# Patient Record
Sex: Male | Born: 1980 | Hispanic: No | Marital: Married | Smoking: Former smoker
Health system: Southern US, Community
[De-identification: ages and names within clinical notes are randomized; demographics above are authoritative.]

## PROBLEM LIST (undated history)

## (undated) HISTORY — PX: ELBOW ARTHROSCOPY: SUR87

---

## 2018-04-24 ENCOUNTER — Encounter: Payer: Self-pay | Admitting: Family Medicine

## 2018-04-24 ENCOUNTER — Ambulatory Visit (INDEPENDENT_AMBULATORY_CARE_PROVIDER_SITE_OTHER): Payer: 59 | Admitting: Family Medicine

## 2018-04-24 VITALS — BP 118/70 | HR 101 | Temp 99.1°F | Resp 12 | Ht 63.0 in | Wt 155.0 lb

## 2018-04-24 DIAGNOSIS — Z0001 Encounter for general adult medical examination with abnormal findings: Secondary | ICD-10-CM

## 2018-04-24 DIAGNOSIS — Z114 Encounter for screening for human immunodeficiency virus [HIV]: Secondary | ICD-10-CM

## 2018-04-24 DIAGNOSIS — Z13220 Encounter for screening for lipoid disorders: Secondary | ICD-10-CM

## 2018-04-24 DIAGNOSIS — R000 Tachycardia, unspecified: Secondary | ICD-10-CM

## 2018-04-24 NOTE — Progress Notes (Signed)
Subjective:  Eddie Mckay is a 37 y.o. male who presents today for his annual comprehensive physical exam and to establish care.  HPI:  He has no acute complaints today.   Lifestyle Diet: No specific diets.  Exercise: No specific exercise. Used to play cricket. Played for professional club. Would like to get back into volleyball and swimming.   Depression screen PHQ 2/9 04/24/2018  Decreased Interest 0  Down, Depressed, Hopeless 0  PHQ - 2 Score 0   Health Maintenance Due  Topic Date Due  . HIV Screening  01/22/1995  . TETANUS/TDAP  01/22/1999    ROS: Per HPI, otherwise a complete review of systems was negative.   PMH:  The following were reviewed and entered/updated in epic: History reviewed. No pertinent past medical history. There are no active problems to display for this patient.  Past Surgical History:  Procedure Laterality Date  . ELBOW ARTHROSCOPY Right    When he was in third grade had elbow dislocation    Family History  Problem Relation Age of Onset  . Arthritis Mother   . Hypertension Mother   . Diabetes Father   . Hypertension Father    Medications- reviewed and updated No current outpatient medications on file.   No current facility-administered medications for this visit.    Allergies-reviewed and updated No Known Allergies  Social History   Socioeconomic History  . Marital status: Married    Spouse name: Not on file  . Number of children: 2  . Years of education: Not on file  . Highest education level: Not on file  Occupational History  . Not on file  Social Needs  . Financial resource strain: Not on file  . Food insecurity:    Worry: Not on file    Inability: Not on file  . Transportation needs:    Medical: Not on file    Non-medical: Not on file  Tobacco Use  . Smoking status: Never Smoker  . Smokeless tobacco: Never Used  Substance and Sexual Activity  . Alcohol use: Yes    Comment: Occasionall  . Drug use: Never  .  Sexual activity: Not on file  Lifestyle  . Physical activity:    Days per week: Not on file    Minutes per session: Not on file  . Stress: Not on file  Relationships  . Social connections:    Talks on phone: Not on file    Gets together: Not on file    Attends religious service: Not on file    Active member of club or organization: Not on file    Attends meetings of clubs or organizations: Not on file    Relationship status: Not on file  Other Topics Concern  . Not on file  Social History Narrative  . Not on file    Objective:  Physical Exam: BP 118/70   Pulse (!) 101   Temp 99.1 F (37.3 C) (Oral)   Resp 12   Ht  (1.6 m)   Wt 155 lb (70.3 kg)   SpO2 96%   BMI 27.46 kg/m   Body mass index is 27.46 kg/m. Wt Readings from Last 3 Encounters:  04/24/18 155 lb (70.3 kg)  Gen: NAD, resting comfortably HEENT: TMs normal bilaterally. OP clear. No thyromegaly noted.  CV: RRR with no murmurs appreciated Pulm: NWOB, CTAB with no crackles, wheezes, or rhonchi GI: Normal bowel sounds present. Soft, Nontender, Nondistended. MSK: no edema, cyanosis, or clubbing noted Skin: warm,  dry Neuro: CN2-12 grossly intact. Strength 5/5 in upper and lower extremities. Reflexes symmetric and intact bilaterally.  Psych: Normal affect and thought content  Assessment/Plan:  Tachycardia Heart rate 101 on initial vitals.  Regular rate on physical exam.  Check CBC and CMET.  Preventative Healthcare: Patient will return soon for fasting blood work. Check lipid panel and HIV antibody.   Patient Counseling(The following topics were reviewed and/or handout was given):  -Nutrition: Stressed importance of moderation in sodium/caffeine intake, saturated fat and cholesterol, caloric balance, sufficient intake of fresh fruits, vegetables, and fiber.  -Stressed the importance of regular exercise.   -Substance Abuse: Discussed cessation/primary prevention of tobacco, alcohol, or other drug use;  driving or other dangerous activities under the influence; availability of treatment for abuse.   -Injury prevention: Discussed safety belts, safety helmets, smoke detector, smoking near bedding or upholstery.   -Sexuality: Discussed sexually transmitted diseases, partner selection, use of condoms, avoidance of unintended pregnancy and contraceptive alternatives.   -Dental health: Discussed importance of regular tooth brushing, flossing, and dental visits.  -Health maintenance and immunizations reviewed. Please refer to Health maintenance section.  Return to care in 1 year for next preventative visit.   Katina Degree. Jimmey Ralph, MD 04/24/2018 3:40 PM

## 2018-04-24 NOTE — Patient Instructions (Signed)
Preventive Care 18-39 Years, Male Preventive care refers to lifestyle choices and visits with your health care provider that can promote health and wellness. What does preventive care include?  A yearly physical exam. This is also called an annual well check.  Dental exams once or twice a year.  Routine eye exams. Ask your health care provider how often you should have your eyes checked.  Personal lifestyle choices, including: ? Daily care of your teeth and gums. ? Regular physical activity. ? Eating a healthy diet. ? Avoiding tobacco and drug use. ? Limiting alcohol use. ? Practicing safe sex. What happens during an annual well check? The services and screenings done by your health care provider during your annual well check will depend on your age, overall health, lifestyle risk factors, and family history of disease. Counseling Your health care provider may ask you questions about your:  Alcohol use.  Tobacco use.  Drug use.  Emotional well-being.  Home and relationship well-being.  Sexual activity.  Eating habits.  Work and work Statistician.  Screening You may have the following tests or measurements:  Height, weight, and BMI.  Blood pressure.  Lipid and cholesterol levels. These may be checked every 5 years starting at age 34.  Diabetes screening. This is done by checking your blood sugar (glucose) after you have not eaten for a while (fasting).  Skin check.  Hepatitis C blood test.  Hepatitis B blood test.  Sexually transmitted disease (STD) testing.  Discuss your test results, treatment options, and if necessary, the need for more tests with your health care provider. Vaccines Your health care provider may recommend certain vaccines, such as:  Influenza vaccine. This is recommended every year.  Tetanus, diphtheria, and acellular pertussis (Tdap, Td) vaccine. You may need a Td booster every 10 years.  Varicella vaccine. You may need this if you  have not been vaccinated.  HPV vaccine. If you are 23 or younger, you may need three doses over 6 months.  Measles, mumps, and rubella (MMR) vaccine. You may need at least one dose of MMR.You may also need a second dose.  Pneumococcal 13-valent conjugate (PCV13) vaccine. You may need this if you have certain conditions and have not been vaccinated.  Pneumococcal polysaccharide (PPSV23) vaccine. You may need one or two doses if you smoke cigarettes or if you have certain conditions.  Meningococcal vaccine. One dose is recommended if you are age 65-21 years and a first-year college student living in a residence hall, or if you have one of several medical conditions. You may also need additional booster doses.  Hepatitis A vaccine. You may need this if you have certain conditions or if you travel or work in places where you may be exposed to hepatitis A.  Hepatitis B vaccine. You may need this if you have certain conditions or if you travel or work in places where you may be exposed to hepatitis B.  Haemophilus influenzae type b (Hib) vaccine. You may need this if you have certain risk factors.  Talk to your health care provider about which screenings and vaccines you need and how often you need them. This information is not intended to replace advice given to you by your health care provider. Make sure you discuss any questions you have with your health care provider. Document Released: 02/01/2002 Document Revised: 08/25/2016 Document Reviewed: 10/07/2015 Elsevier Interactive Patient Education  [employer].

## 2018-05-04 ENCOUNTER — Other Ambulatory Visit (INDEPENDENT_AMBULATORY_CARE_PROVIDER_SITE_OTHER): Payer: 59

## 2018-05-04 DIAGNOSIS — Z13220 Encounter for screening for lipoid disorders: Secondary | ICD-10-CM | POA: Diagnosis not present

## 2018-05-04 DIAGNOSIS — R000 Tachycardia, unspecified: Secondary | ICD-10-CM | POA: Diagnosis not present

## 2018-05-04 DIAGNOSIS — Z114 Encounter for screening for human immunodeficiency virus [HIV]: Secondary | ICD-10-CM

## 2018-05-04 LAB — CBC
HCT: 43.9 % (ref 39.0–52.0)
Hemoglobin: 15.1 g/dL (ref 13.0–17.0)
MCHC: 34.4 g/dL (ref 30.0–36.0)
MCV: 82.1 fl (ref 78.0–100.0)
Platelets: 300 10*3/uL (ref 150.0–400.0)
RBC: 5.35 Mil/uL (ref 4.22–5.81)
RDW: 13.2 % (ref 11.5–15.5)
WBC: 7.2 10*3/uL (ref 4.0–10.5)

## 2018-05-04 LAB — COMPREHENSIVE METABOLIC PANEL
ALBUMIN: 4.4 g/dL (ref 3.5–5.2)
ALT: 38 U/L (ref 0–53)
AST: 21 U/L (ref 0–37)
Alkaline Phosphatase: 70 U/L (ref 39–117)
BILIRUBIN TOTAL: 0.5 mg/dL (ref 0.2–1.2)
BUN: 14 mg/dL (ref 6–23)
CO2: 28 meq/L (ref 19–32)
CREATININE: 0.82 mg/dL (ref 0.40–1.50)
Calcium: 9.6 mg/dL (ref 8.4–10.5)
Chloride: 103 mEq/L (ref 96–112)
GFR: 111.59 mL/min (ref >60.00)
Glucose, Bld: 94 mg/dL (ref 70–99)
Potassium: 4.7 mEq/L (ref 3.5–5.1)
Sodium: 139 mEq/L (ref 135–145)
TOTAL PROTEIN: 7.8 g/dL (ref 6.0–8.3)

## 2018-05-04 LAB — LIPID PANEL
CHOL/HDL RATIO: 5
CHOLESTEROL: 234 mg/dL — AB (ref 0–200)
HDL: 43.2 mg/dL (ref >39.00)
LDL Cholesterol: 164 mg/dL — ABNORMAL HIGH (ref 0–99)
NonHDL: 190.69
TRIGLYCERIDES: 133 mg/dL (ref 0.0–149.0)
VLDL: 26.6 mg/dL (ref 0.0–40.0)

## 2018-05-04 LAB — TSH: TSH: 1.9 u[IU]/mL (ref 0.35–4.50)

## 2018-05-05 LAB — HIV ANTIBODY (ROUTINE TESTING W REFLEX): HIV: NONREACTIVE

## 2018-07-06 ENCOUNTER — Ambulatory Visit: Payer: 59 | Admitting: Sports Medicine

## 2018-07-06 ENCOUNTER — Encounter: Payer: Self-pay | Admitting: Sports Medicine

## 2018-07-06 VITALS — BP 114/72 | HR 93 | Ht 63.0 in | Wt 153.4 lb

## 2018-07-06 DIAGNOSIS — M9908 Segmental and somatic dysfunction of rib cage: Secondary | ICD-10-CM

## 2018-07-06 DIAGNOSIS — K219 Gastro-esophageal reflux disease without esophagitis: Secondary | ICD-10-CM

## 2018-07-06 DIAGNOSIS — Z720 Tobacco use: Secondary | ICD-10-CM

## 2018-07-06 DIAGNOSIS — M25512 Pain in left shoulder: Secondary | ICD-10-CM | POA: Diagnosis not present

## 2018-07-06 DIAGNOSIS — M9902 Segmental and somatic dysfunction of thoracic region: Secondary | ICD-10-CM

## 2018-07-06 DIAGNOSIS — M9901 Segmental and somatic dysfunction of cervical region: Secondary | ICD-10-CM | POA: Diagnosis not present
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
## 2018-07-06 NOTE — Progress Notes (Signed)
PROCEDURE NOTE : OSTEOPATHIC MANIPULATION The decision today to treat with Osteopathic Manipulative Therapy (OMT) was based on physical exam findings. Verbal consent was obtained following a discussion with the patient regarding the of risks, benefits and potential side effects, including an acute pain flare,post manipulation soreness and need for repeat treatments.     NONE  Manipulation was performed as below: Regions treated: Cervical spine, Ribs and Thoracic spine OMT Techniques Used: HVLA, muscle energy and myofascial release  The patient tolerated the treatment well and reported Improved symptoms following treatment today. Patient was given medications, exercises, stretches and lifestyle modifications per AVS and verbally.   OSTEOPATHIC/STRUCTURAL EXAM:   C2-C4 rotated right, side bent left C6 rotated left, side bent left T2-T4 neutral rotated left, side bent right Posterior rib 6 on the right.

## 2018-10-18 ENCOUNTER — Ambulatory Visit (INDEPENDENT_AMBULATORY_CARE_PROVIDER_SITE_OTHER): Payer: 59 | Admitting: Surgical

## 2018-10-18 ENCOUNTER — Encounter: Payer: Self-pay | Admitting: Family Medicine

## 2018-10-18 DIAGNOSIS — Z23 Encounter for immunization: Secondary | ICD-10-CM

## 2019-01-12 ENCOUNTER — Encounter (HOSPITAL_BASED_OUTPATIENT_CLINIC_OR_DEPARTMENT_OTHER): Payer: Self-pay | Admitting: Emergency Medicine

## 2019-01-12 ENCOUNTER — Emergency Department (HOSPITAL_BASED_OUTPATIENT_CLINIC_OR_DEPARTMENT_OTHER)
Admission: EM | Admit: 2019-01-12 | Discharge: 2019-01-12 | Disposition: A | Discharge status: Home / Self Care | Payer: 59 | Attending: Emergency Medicine | Admitting: Emergency Medicine

## 2019-01-12 ENCOUNTER — Other Ambulatory Visit: Payer: Self-pay

## 2019-01-12 ENCOUNTER — Emergency Department (HOSPITAL_BASED_OUTPATIENT_CLINIC_OR_DEPARTMENT_OTHER): Payer: 59

## 2019-01-12 DIAGNOSIS — Y939 Activity, unspecified: Secondary | ICD-10-CM | POA: Diagnosis not present

## 2019-01-12 DIAGNOSIS — Y92410 Unspecified street and highway as the place of occurrence of the external cause: Secondary | ICD-10-CM | POA: Diagnosis not present

## 2019-01-12 DIAGNOSIS — S161XXA Strain of muscle, fascia and tendon at neck level, initial encounter: Secondary | ICD-10-CM | POA: Diagnosis not present

## 2019-01-12 DIAGNOSIS — Y999 Unspecified external cause status: Secondary | ICD-10-CM | POA: Insufficient documentation

## 2019-01-12 DIAGNOSIS — F17210 Nicotine dependence, cigarettes, uncomplicated: Secondary | ICD-10-CM | POA: Insufficient documentation

## 2019-01-12 DIAGNOSIS — S20211A Contusion of right front wall of thorax, initial encounter: Secondary | ICD-10-CM

## 2019-01-12 DIAGNOSIS — S39012A Strain of muscle, fascia and tendon of lower back, initial encounter: Secondary | ICD-10-CM

## 2019-01-12 DIAGNOSIS — S199XXA Unspecified injury of neck, initial encounter: Secondary | ICD-10-CM | POA: Diagnosis present

## 2019-01-12 MED ORDER — IBUPROFEN 400 MG PO TABS
600.0000 mg | ORAL_TABLET | Freq: Once | ORAL | Status: AC
  Administered 2019-01-12: 600 mg via ORAL
  Filled 2019-01-12: qty 1

## 2019-01-12 NOTE — ED Notes (Addendum)
This RN, Mordecai Maes, completed Triage process

## 2019-01-12 NOTE — ED Triage Notes (Signed)
C-collar place during triage

## 2019-01-12 NOTE — Discharge Instructions (Addendum)
If you develop severe headache, severe pain, trouble breathing, weakness or numbness in your arms or legs, trouble speaking or swallowing, or any other new/concerning symptoms then return to the ER for evaluation.
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
## 2019-01-12 NOTE — ED Triage Notes (Signed)
Pt was ambulatory in triage
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
## 2019-02-02 ENCOUNTER — Ambulatory Visit: Payer: 59 | Admitting: Family Medicine

## 2019-02-02 ENCOUNTER — Encounter: Payer: Self-pay | Admitting: Family Medicine

## 2019-02-02 VITALS — BP 110/78 | HR 102 | Temp 99.2°F | Ht 63.0 in | Wt 150.8 lb

## 2019-02-02 DIAGNOSIS — R0781 Pleurodynia: Secondary | ICD-10-CM

## 2019-02-02 DIAGNOSIS — J101 Influenza due to other identified influenza virus with other respiratory manifestations: Secondary | ICD-10-CM | POA: Diagnosis not present

## 2019-02-02 LAB — POCT INFLUENZA A/B
Influenza A, POC: POSITIVE — AB
Influenza B, POC: NEGATIVE

## 2019-02-02 MED ORDER — IPRATROPIUM BROMIDE 0.06 % NA SOLN: 2.0000 | Freq: Four times a day (QID) | NASAL | 0 refills | Status: DC

## 2019-02-02 MED ORDER — GUAIFENESIN-CODEINE 100-10 MG/5ML PO SOLN: 5.0000 mL | Freq: Three times a day (TID) | ORAL | 0 refills | Status: DC | PRN
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
## 2019-02-02 NOTE — Patient Instructions (Signed)
It was very nice to see you today!  You have the flu.  This should continue to improve over the next few days.  Please try the nasal spray and cough medication.  Please stay well-hydrated.  You can take Tylenol/or Motrin as needed.  Please let me know if your symptoms worsen or do not improve the next few days.  Take care, Dr Jimmey Ralph
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
## 2020-05-12 ENCOUNTER — Encounter: Payer: Self-pay | Admitting: Family Medicine

## 2020-05-12 ENCOUNTER — Ambulatory Visit (INDEPENDENT_AMBULATORY_CARE_PROVIDER_SITE_OTHER): Payer: 59 | Admitting: Family Medicine

## 2020-05-12 ENCOUNTER — Other Ambulatory Visit: Payer: Self-pay

## 2020-05-12 VITALS — BP 120/80 | HR 76 | Temp 98.4°F | Ht 62.0 in | Wt 151.2 lb

## 2020-05-12 DIAGNOSIS — Z6827 Body mass index (BMI) 27.0-27.9, adult: Secondary | ICD-10-CM | POA: Diagnosis not present

## 2020-05-12 DIAGNOSIS — F17200 Nicotine dependence, unspecified, uncomplicated: Secondary | ICD-10-CM

## 2020-05-12 DIAGNOSIS — E663 Overweight: Secondary | ICD-10-CM | POA: Diagnosis not present

## 2020-05-12 DIAGNOSIS — E785 Hyperlipidemia, unspecified: Secondary | ICD-10-CM | POA: Diagnosis not present

## 2020-05-12 DIAGNOSIS — Z0001 Encounter for general adult medical examination with abnormal findings: Secondary | ICD-10-CM | POA: Diagnosis not present

## 2020-05-12 LAB — COMPREHENSIVE METABOLIC PANEL
ALT: 33 U/L (ref 0–53)
AST: 26 U/L (ref 0–37)
Albumin: 4.6 g/dL (ref 3.5–5.2)
Alkaline Phosphatase: 98 U/L (ref 39–117)
BUN: 10 mg/dL (ref 6–23)
CO2: 27 mEq/L (ref 19–32)
Calcium: 9.1 mg/dL (ref 8.4–10.5)
Chloride: 104 mEq/L (ref 96–112)
Creatinine, Ser: 0.82 mg/dL (ref 0.40–1.50)
GFR: 103.9 mL/min (ref >60.00)
Glucose, Bld: 94 mg/dL (ref 70–99)
Potassium: 4.4 mEq/L (ref 3.5–5.1)
Sodium: 138 mEq/L (ref 135–145)
Total Bilirubin: 0.5 mg/dL (ref 0.2–1.2)
Total Protein: 7.2 g/dL (ref 6.0–8.3)

## 2020-05-12 LAB — TSH: TSH: 2.38 u[IU]/mL (ref 0.35–4.50)

## 2020-05-12 LAB — CBC
HCT: 41.1 % (ref 39.0–52.0)
Hemoglobin: 14 g/dL (ref 13.0–17.0)
MCHC: 34 g/dL (ref 30.0–36.0)
MCV: 81.9 fl (ref 78.0–100.0)
Platelets: 279 10*3/uL (ref 150.0–400.0)
RBC: 5.02 Mil/uL (ref 4.22–5.81)
RDW: 13.7 % (ref 11.5–15.5)
WBC: 6.9 10*3/uL (ref 4.0–10.5)

## 2020-05-12 LAB — LIPID PANEL
Cholesterol: 231 mg/dL — ABNORMAL HIGH (ref 0–200)
HDL: 43.6 mg/dL (ref >39.00)
LDL Cholesterol: 160 mg/dL — ABNORMAL HIGH (ref 0–99)
NonHDL: 187.62
Total CHOL/HDL Ratio: 5
Triglycerides: 138 mg/dL (ref 0.0–149.0)
VLDL: 27.6 mg/dL (ref 0.0–40.0)

## 2020-05-12 NOTE — Assessment & Plan Note (Signed)
Check CBC, CMET, TSH, and lipid panel. 

## 2020-05-12 NOTE — Progress Notes (Signed)
Chief Complaint:  Eddie Mckay is a 39 y.o. male who presents today for his annual comprehensive physical exam.    Assessment/Plan:  Chronic Problems Addressed Today: Nicotine dependence with current use Patient was asked about his tobacco use today and was strongly advised to quit. Patient is currently contemplative. We reviewed treatment options to assist him quit smoking including NRT, Chantix, and Bupropion. Follow up at next office visit.   Total time spent counseling approximately 3 minutes.    Dyslipidemia Check CBC, CMET, TSH, and lipid panel.    Body mass index is 27.65 kg/m. / Overweight BMI Metric Follow Up - 05/12/20 0944      BMI Metric Follow Up-Please document annually   BMI Metric Follow Up  Education provided        Preventative Healthcare: Check CBC, CMET, TSH, lipid panel.   Patient Counseling(The following topics were reviewed and/or handout was given):  -Nutrition: Stressed importance of moderation in sodium/caffeine intake, saturated fat and cholesterol, caloric balance, sufficient intake of fresh fruits, vegetables, and fiber.  -Stressed the importance of regular exercise.   -Substance Abuse: Discussed cessation/primary prevention of tobacco, alcohol, or other drug use; driving or other dangerous activities under the influence; availability of treatment for abuse.   -Injury prevention: Discussed safety belts, safety helmets, smoke detector, smoking near bedding or upholstery.   -Sexuality: Discussed sexually transmitted diseases, partner selection, use of condoms, avoidance of unintended pregnancy and contraceptive alternatives.   -Dental health: Discussed importance of regular tooth brushing, flossing, and dental visits.  -Health maintenance and immunizations reviewed. Please refer to Health maintenance section.  Return to care in 1 year for next preventative visit.     Subjective:  HPI:  He has no acute complaints today.   Lifestyle Diet:  Balanced. Trying to get more fruits and vegetables.  Exercise: More active. Likes to walk. Getting about 30 minutes per day.   Depression screen PHQ 2/9 04/24/2018  Decreased Interest 0  Down, Depressed, Hopeless 0  PHQ - 2 Score 0    Health Maintenance Due  Topic Date Due  . TETANUS/TDAP  Never done     ROS: Per HPI, otherwise a complete review of systems was negative.   PMH:  The following were reviewed and entered/updated in epic: History reviewed. No pertinent past medical history. Patient Active Problem List   Diagnosis Date Noted  . Dyslipidemia 05/12/2020  . Nicotine dependence with current use 05/12/2020   Past Surgical History:  Procedure Laterality Date  . ELBOW ARTHROSCOPY Right    When he was in third grade had elbow dislocation    Family History  Problem Relation Age of Onset  . Arthritis Mother   . Hypertension Mother   . Diabetes Father   . Hypertension Father     Medications- reviewed and updated No current outpatient medications on file.   No current facility-administered medications for this visit.    Allergies-reviewed and updated No Known Allergies  Social History   Socioeconomic History  . Marital status: Married    Spouse name: Not on file  . Number of children: 2  . Years of education: Not on file  . Highest education level: Not on file  Occupational History  . Not on file  Tobacco Use  . Smoking status: Light Tobacco Smoker    Types: Cigarettes  . Smokeless tobacco: Never Used  Substance and Sexual Activity  . Alcohol use: Yes    Comment: Occasionall  . Drug use: Never  .  Sexual activity: Not on file  Other Topics Concern  . Not on file  Social History Narrative  . Not on file   Social Determinants of Health   Financial Resource Strain:   . Difficulty of Paying Living Expenses:   Food Insecurity:   . Worried About Charity fundraiser in the Last Year:   . Arboriculturist in the Last Year:   Transportation Needs:   .  Film/video editor (Medical):   Marland Kitchen Lack of Transportation (Non-Medical):   Physical Activity:   . Days of Exercise per Week:   . Minutes of Exercise per Session:   Stress:   . Feeling of Stress :   Social Connections:   . Frequency of Communication with Friends and Family:   . Frequency of Social Gatherings with Friends and Family:   . Attends Religious Services:   . Active Member of Clubs or Organizations:   . Attends Archivist Meetings:   Marland Kitchen Marital Status:         Objective:  Physical Exam: BP 120/80 (BP Location: Left Arm, Patient Position: Sitting, Cuff Size: Normal)   Pulse 76   Temp 98.4 F (36.9 C) (Temporal)   Ht 5\' 2"  (1.575 m)   Wt 151 lb 3.2 oz (68.6 kg)   SpO2 96%   BMI 27.65 kg/m   Body mass index is 27.65 kg/m. Wt Readings from Last 3 Encounters:  05/12/20 151 lb 3.2 oz (68.6 kg)  02/02/19 150 lb 12 oz (68.4 kg)  01/12/19 149 lb 14.6 oz (68 kg)   Gen: NAD, resting comfortably HEENT: TMs normal bilaterally. OP clear. No thyromegaly noted.  CV: RRR with no murmurs appreciated Pulm: NWOB, CTAB with no crackles, wheezes, or rhonchi GI: Normal bowel sounds present. Soft, Nontender, Nondistended. MSK: no edema, cyanosis, or clubbing noted Skin: warm, dry Neuro: CN2-12 grossly intact. Strength 5/5 in upper and lower extremities. Reflexes symmetric and intact bilaterally.  Psych: Normal affect and thought content     Jaelin Devincentis M. Jerline Pain, MD 05/12/2020 9:44 AM

## 2020-05-12 NOTE — Patient Instructions (Signed)
It was very nice to see you today!  We will check blood work today.  Keep up the good work.  Come back in 1 year for your next physical or sooner if needed.   Take care, Dr Jerline Pain  Please try these tips to maintain a healthy lifestyle:   Eat at least 3 REAL meals and 1-2 snacks per day.  Aim for no more than 5 hours between eating.  If you eat breakfast, please do so within one hour of getting up.    Each meal should contain half fruits/vegetables, one quarter protein, and one quarter carbs (no bigger than a computer mouse)   Cut down on sweet beverages. This includes juice, soda, and sweet tea.     Drink at least 1 glass of water with each meal and aim for at least 8 glasses per day   Exercise at least 150 minutes every week.    Preventive Care 87-40 Years Old, Male Preventive care refers to lifestyle choices and visits with your health care provider that can promote health and wellness. This includes:  A yearly physical exam. This is also called an annual well check.  Regular dental and eye exams.  Immunizations.  Screening for certain conditions.  Healthy lifestyle choices, such as eating a healthy diet, getting regular exercise, not using drugs or products that contain nicotine and tobacco, and limiting alcohol use. What can I expect for my preventive care visit? Physical exam Your health care provider will check:  Height and weight. These may be used to calculate body mass index (BMI), which is a measurement that tells if you are at a healthy weight.  Heart rate and blood pressure.  Your skin for abnormal spots. Counseling Your health care provider may ask you questions about:  Alcohol, tobacco, and drug use.  Emotional well-being.  Home and relationship well-being.  Sexual activity.  Eating habits.  Work and work Statistician. What immunizations do I need?  Influenza (flu) vaccine  This is recommended every year. Tetanus, diphtheria, and  pertussis (Tdap) vaccine  You may need a Td booster every 10 years. Varicella (chickenpox) vaccine  You may need this vaccine if you have not already been vaccinated. Zoster (shingles) vaccine  You may need this after age 61. Measles, mumps, and rubella (MMR) vaccine  You may need at least one dose of MMR if you were born in 1957 or later. You may also need a second dose. Pneumococcal conjugate (PCV13) vaccine  You may need this if you have certain conditions and were not previously vaccinated. Pneumococcal polysaccharide (PPSV23) vaccine  You may need one or two doses if you smoke cigarettes or if you have certain conditions. Meningococcal conjugate (MenACWY) vaccine  You may need this if you have certain conditions. Hepatitis A vaccine  You may need this if you have certain conditions or if you travel or work in places where you may be exposed to hepatitis A. Hepatitis B vaccine  You may need this if you have certain conditions or if you travel or work in places where you may be exposed to hepatitis B. Haemophilus influenzae type b (Hib) vaccine  You may need this if you have certain risk factors. Human papillomavirus (HPV) vaccine  If recommended by your health care provider, you may need three doses over 6 months. You may receive vaccines as individual doses or as more than one vaccine together in one shot (combination vaccines). Talk with your health care provider about the risks and benefits  of combination vaccines. What tests do I need? Blood tests  Lipid and cholesterol levels. These may be checked every 5 years, or more frequently if you are over 14 years old.  Hepatitis C test.  Hepatitis B test. Screening  Lung cancer screening. You may have this screening every year starting at age 41 if you have a 30-pack-year history of smoking and currently smoke or have quit within the past 15 years.  Prostate cancer screening. Recommendations will vary depending on your  family history and other risks.  Colorectal cancer screening. All adults should have this screening starting at age 11 and continuing until age 37. Your health care provider may recommend screening at age 55 if you are at increased risk. You will have tests every 1-10 years, depending on your results and the type of screening test.  Diabetes screening. This is done by checking your blood sugar (glucose) after you have not eaten for a while (fasting). You may have this done every 1-3 years.  Sexually transmitted disease (STD) testing. Follow these instructions at home: Eating and drinking  Eat a diet that includes fresh fruits and vegetables, whole grains, lean protein, and low-fat dairy products.  Take vitamin and mineral supplements as recommended by your health care provider.  Do not drink alcohol if your health care provider tells you not to drink.  If you drink alcohol: ? Limit how much you have to 0-2 drinks a day. ? Be aware of how much alcohol is in your drink. In the [place] one drink equals one 12 oz bottle of beer (355 mL), one 5 oz glass of wine (148 mL), or one 1 oz glass of hard liquor (44 mL). Lifestyle  Take daily care of your teeth and gums.  Stay active. Exercise for at least 30 minutes on 5 or more days each week.  Do not use any products that contain nicotine or tobacco, such as cigarettes, e-cigarettes, and chewing tobacco. If you need help quitting, ask your health care provider.  If you are sexually active, practice safe sex. Use a condom or other form of protection to prevent STIs (sexually transmitted infections).  Talk with your health care provider about taking a low-dose aspirin every day starting at age 66. What's next?  Go to your health care provider once a year for a well check visit.  Ask your health care provider how often you should have your eyes and teeth checked.  Stay up to date on all vaccines. This information is not intended to replace  advice given to you by your health care provider. Make sure you discuss any questions you have with your health care provider. Document Revised: 11/30/2018 Document Reviewed: 11/30/2018 Elsevier Patient Education  2020 [employer].

## 2020-05-12 NOTE — Assessment & Plan Note (Signed)
Patient was asked about his tobacco use today and was strongly advised to quit. Patient is currently contemplative. We reviewed treatment options to assist him quit smoking including NRT, Chantix, and Bupropion. Follow up at next office visit.   Total time spent counseling approximately 3 minutes.   

## 2020-05-13 NOTE — Progress Notes (Signed)
Please inform patient of the following:  Cholesterol is mildly elevated but everything is STABLE. Do not need to start any medications at this time. Would like for him to keep working on diet and exercise and we can recheck in a year or so.  Katina Degree. Jimmey Ralph, MD 05/13/2020 1:03 PM

## 2020-07-14 ENCOUNTER — Inpatient Hospital Stay
Admission: RE | Admit: 2020-07-14 | Discharge: 2020-07-14 | Disposition: A | Discharge status: Home / Self Care | Payer: 59 | Source: Ambulatory Visit

## 2020-07-15 ENCOUNTER — Other Ambulatory Visit: Payer: Self-pay

## 2020-07-15 ENCOUNTER — Encounter: Payer: Self-pay | Admitting: Physician Assistant

## 2020-07-15 ENCOUNTER — Ambulatory Visit: Payer: 59 | Admitting: Physician Assistant

## 2020-07-15 VITALS — BP 120/82 | HR 108 | Temp 98.4°F | Ht 62.0 in | Wt 151.2 lb

## 2020-07-15 DIAGNOSIS — M7989 Other specified soft tissue disorders: Secondary | ICD-10-CM | POA: Diagnosis not present

## 2020-07-15 DIAGNOSIS — S61211A Laceration without foreign body of left index finger without damage to nail, initial encounter: Secondary | ICD-10-CM

## 2020-07-15 DIAGNOSIS — Z23 Encounter for immunization: Secondary | ICD-10-CM | POA: Diagnosis not present

## 2020-07-15 MED ORDER — MUPIROCIN 2 % EX OINT: TOPICAL_OINTMENT | CUTANEOUS | 0 refills | Status: DC

## 2020-07-15 MED ORDER — DOXYCYCLINE HYCLATE 100 MG PO TABS: 100.0000 mg | ORAL_TABLET | Freq: Two times a day (BID) | ORAL | 0 refills | Status: DC

## 2020-07-15 NOTE — Patient Instructions (Signed)
It was great to see you!  Start oral doxycycline to cover for infection. May use topical bacitracin if needed.  Follow-up if worsening symptoms.  Take care,  Jarold Motto PA-C

## 2020-07-15 NOTE — Progress Notes (Signed)
Eddie Mckay is a 39 y.o. male here for a new problem.  I acted as a Neurosurgeon for [employer], PA-C [employer], [place]  History of Present Illness:   Chief Complaint  Patient presents with  . Rash    HPI  Rash Pt c/o of a rash on his left index finger. Pt noticed rash last week. He was cutting jackfruit and noticed a small laceration on his left index finger. The irritation and rash has spread down to his thumb. Rash is red itchy, painful, and swollen. Pain was worse on Sunday than today. He put mupirocin ointment on rash, which did not help much.  Denies: fevers, chills, malaise  Denies concerns for foreign body.  Last tetanus unknown  R-handed   Social History   Tobacco Use  . Smoking status: Light Tobacco Smoker    Types: Cigarettes  . Smokeless tobacco: Never Used  Vaping Use  . Vaping Use: Never used  Substance Use Topics  . Alcohol use: Yes    Comment: Occasionall  . Drug use: Never    Past Surgical History:  Procedure Laterality Date  . ELBOW ARTHROSCOPY Right    When he was in third grade had elbow dislocation    Family History  Problem Relation Age of Onset  . Arthritis Mother   . Hypertension Mother   . Diabetes Father   . Hypertension Father     No Known Allergies  Current Medications:   Current Outpatient Medications:  .  doxycycline (VIBRA-TABS) 100 MG tablet, Take 1 tablet (100 mg total) by mouth 2 (two) times daily., Disp: 20 tablet, Rfl: 0 .  mupirocin ointment (BACTROBAN) 2 %, Apply to affected area 1-2 times daily, Disp: 22 g, Rfl: 0   Review of Systems:   ROS  Negative unless otherwise specified per HPI.  Vitals:   Vitals:   07/15/20 1439  BP: 120/82  Pulse: (!) 108  Temp: 98.4 F (36.9 C)  TempSrc: Temporal  SpO2: 97%  Weight: 151 lb 3.2 oz (68.6 kg)  Height: 5\' 2"  (1.575 m)     Body mass index is 27.65 kg/m.  Physical Exam:   Physical Exam Vitals and nursing note reviewed.  Constitutional:       Appearance: He is well-developed.  HENT:     Head: Normocephalic.  Eyes:     Conjunctiva/sclera: Conjunctivae normal.     Pupils: Pupils are equal, round, and reactive to light.  Pulmonary:     Effort: Pulmonary effort is normal.  Musculoskeletal:        General: Normal range of motion.     Cervical back: Normal range of motion.     Comments: Swelling and erythema to left index finger MCP joint Normal ROM of L thumb and L index finger Possible small laceration to finger pad of L index finger without active discharge   Skin:    General: Skin is warm and dry.  Neurological:     Mental Status: He is alert and oriented to person, place, and time.  Psychiatric:        Behavior: Behavior normal.        Thought Content: Thought content normal.        Judgment: Judgment normal.       Assessment and Plan:   Stein was seen today for rash.  Diagnoses and all orders for this visit:  Laceration of left index finger without damage to nail, foreign body presence unspecified, initial encounter; Swelling of finger Concern for  possible infection. No red flags on exam. Will start oral doxycycline and refilled bactroban today. TDap updated. Worsening precautions advised.  Other orders -     doxycycline (VIBRA-TABS) 100 MG tablet; Take 1 tablet (100 mg total) by mouth 2 (two) times daily. -     mupirocin ointment (BACTROBAN) 2 %; Apply to affected area 1-2 times daily  . Reviewed expectations re: course of current medical issues. . Discussed self-management of symptoms. . Outlined signs and symptoms indicating need for more acute intervention. . Patient verbalized understanding and all questions were answered. . See orders for this visit as documented in the electronic medical record. . Patient received an After-Visit Summary.  CMA or LPN served as scribe during this visit. History, Physical, and Plan performed by medical provider. The above documentation has been reviewed and is  accurate and complete.   Jarold Motto, PA-C

## 2020-09-25 ENCOUNTER — Ambulatory Visit (INDEPENDENT_AMBULATORY_CARE_PROVIDER_SITE_OTHER): Payer: 59

## 2020-09-25 ENCOUNTER — Other Ambulatory Visit: Payer: Self-pay

## 2020-09-25 ENCOUNTER — Encounter: Payer: Self-pay | Admitting: Family Medicine

## 2020-09-25 DIAGNOSIS — Z23 Encounter for immunization: Secondary | ICD-10-CM

## 2021-06-18 DIAGNOSIS — Z20822 Contact with and (suspected) exposure to covid-19: Secondary | ICD-10-CM | POA: Diagnosis not present

## 2021-06-27 DIAGNOSIS — Z20822 Contact with and (suspected) exposure to covid-19: Secondary | ICD-10-CM | POA: Diagnosis not present

## 2021-10-23 ENCOUNTER — Encounter: Payer: Self-pay | Admitting: Family Medicine

## 2021-10-23 ENCOUNTER — Ambulatory Visit (INDEPENDENT_AMBULATORY_CARE_PROVIDER_SITE_OTHER): Payer: 59 | Admitting: Family Medicine

## 2021-10-23 ENCOUNTER — Other Ambulatory Visit: Payer: Self-pay

## 2021-10-23 VITALS — BP 131/87 | HR 87 | Temp 98.3°F | Ht 62.0 in | Wt 162.2 lb

## 2021-10-23 DIAGNOSIS — E785 Hyperlipidemia, unspecified: Secondary | ICD-10-CM

## 2021-10-23 DIAGNOSIS — Z1159 Encounter for screening for other viral diseases: Secondary | ICD-10-CM

## 2021-10-23 DIAGNOSIS — F17200 Nicotine dependence, unspecified, uncomplicated: Secondary | ICD-10-CM

## 2021-10-23 DIAGNOSIS — Z0001 Encounter for general adult medical examination with abnormal findings: Secondary | ICD-10-CM

## 2021-10-23 LAB — LIPID PANEL
Cholesterol: 257 mg/dL — ABNORMAL HIGH (ref 0–200)
HDL: 60.3 mg/dL (ref >39.00)
LDL Cholesterol: 175 mg/dL — ABNORMAL HIGH (ref 0–99)
NonHDL: 196.85
Total CHOL/HDL Ratio: 4
Triglycerides: 110 mg/dL (ref 0.0–149.0)
VLDL: 22 mg/dL (ref 0.0–40.0)

## 2021-10-23 LAB — TSH: TSH: 2.89 u[IU]/mL (ref 0.35–5.50)

## 2021-10-23 LAB — CBC
HCT: 43.5 % (ref 39.0–52.0)
Hemoglobin: 14.6 g/dL (ref 13.0–17.0)
MCHC: 33.7 g/dL (ref 30.0–36.0)
MCV: 81.6 fl (ref 78.0–100.0)
Platelets: 270 10*3/uL (ref 150.0–400.0)
RBC: 5.33 Mil/uL (ref 4.22–5.81)
RDW: 13.8 % (ref 11.5–15.5)
WBC: 9.9 10*3/uL (ref 4.0–10.5)

## 2021-10-23 LAB — COMPREHENSIVE METABOLIC PANEL
ALT: 31 U/L (ref 0–53)
AST: 23 U/L (ref 0–37)
Albumin: 4.6 g/dL (ref 3.5–5.2)
Alkaline Phosphatase: 73 U/L (ref 39–117)
BUN: 10 mg/dL (ref 6–23)
CO2: 28 mEq/L (ref 19–32)
Calcium: 9.6 mg/dL (ref 8.4–10.5)
Chloride: 104 mEq/L (ref 96–112)
Creatinine, Ser: 0.75 mg/dL (ref 0.40–1.50)
GFR: 111.9 mL/min (ref >60.00)
Glucose, Bld: 84 mg/dL (ref 70–99)
Potassium: 4.3 mEq/L (ref 3.5–5.1)
Sodium: 142 mEq/L (ref 135–145)
Total Bilirubin: 0.6 mg/dL (ref 0.2–1.2)
Total Protein: 7.9 g/dL (ref 6.0–8.3)

## 2021-10-23 NOTE — Assessment & Plan Note (Signed)
Check labs 

## 2021-10-23 NOTE — Assessment & Plan Note (Signed)
Patient was asked about his tobacco use today and was strongly advised to quit. Patient is currently contemplative. We reviewed treatment options to assist him quit smoking including NRT, Chantix, and Bupropion. Follow up at next office visit.   Total time spent counseling approximately 3 minutes.   

## 2021-10-23 NOTE — Patient Instructions (Signed)
It was very nice to see you today!  We will check blood work.  Please let me know if you would like any assistance with stopping smoking.  We will see back in a year.  Please come back to see me sooner if needed.  Take care, Dr Jimmey Ralph  PLEASE NOTE:  If you had any lab tests please let us know if you have not heard back within a few days. You may see your results on mychart before we have a chance to review them but we will give you a call once they are reviewed by [place]. If we ordered any referrals today, please let us know if you have not heard from their office within the next week.   Please try these tips to maintain a healthy lifestyle:  Eat at least 3 REAL meals and 1-2 snacks per day.  Aim for no more than 5 hours between eating.  If you eat breakfast, please do so within one hour of getting up.   Each meal should contain half fruits/vegetables, one quarter protein, and one quarter carbs (no bigger than a computer mouse)  Cut down on sweet beverages. This includes juice, soda, and sweet tea.   Drink at least 1 glass of water with each meal and aim for at least 8 glasses per day  Exercise at least 150 minutes every week.    Preventive Care 19-66 Years Old, Male Preventive care refers to lifestyle choices and visits with your health care provider that can promote health and wellness. Preventive care visits are also called wellness exams. What can I expect for my preventive care visit? Counseling During your preventive care visit, your health care provider may ask about your: Medical history, including: Past medical problems. Family medical history. Current health, including: Emotional well-being. Home life and relationship well-being. Sexual activity. Lifestyle, including: Alcohol, nicotine or tobacco, and drug use. Access to firearms. Diet, exercise, and sleep habits. Safety issues such as seatbelt and bike helmet use. Sunscreen use. Work and work Astronomer. Physical  exam Your health care provider will check your: Height and weight. These may be used to calculate your BMI (body mass index). BMI is a measurement that tells if you are at a healthy weight. Waist circumference. This measures the distance around your waistline. This measurement also tells if you are at a healthy weight and may help predict your risk of certain diseases, such as type 2 diabetes and high blood pressure. Heart rate and blood pressure. Body temperature. Skin for abnormal spots. What immunizations do I need? Vaccines are usually given at various ages, according to a schedule. Your health care provider will recommend vaccines for you based on your age, medical history, and lifestyle or other factors, such as travel or where you work. What tests do I need? Screening Your health care provider may recommend screening tests for certain conditions. This may include: Lipid and cholesterol levels. Diabetes screening. This is done by checking your blood sugar (glucose) after you have not eaten for a while (fasting). Hepatitis B test. Hepatitis C test. HIV (human immunodeficiency virus) test. STI (sexually transmitted infection) testing, if you are at risk. Lung cancer screening. Prostate cancer screening. Colorectal cancer screening. Talk with your health care provider about your test results, treatment options, and if necessary, the need for more tests. Follow these instructions at home: Eating and drinking  Eat a diet that includes fresh fruits and vegetables, whole grains, lean protein, and low-fat dairy products. Take vitamin and mineral  supplements as recommended by your health care provider. Do not drink alcohol if your health care provider tells you not to drink. If you drink alcohol: Limit how much you have to 0-2 drinks a day. Know how much alcohol is in your drink. In the [place] one drink equals one 12 oz bottle of beer (355 mL), one 5 oz glass of wine (148 mL), or one 1 oz  glass of hard liquor (44 mL). Lifestyle Brush your teeth every morning and night with fluoride toothpaste. Floss one time each day. Exercise for at least 30 minutes 5 or more days each week. Do not use any products that contain nicotine or tobacco. These products include cigarettes, chewing tobacco, and vaping devices, such as e-cigarettes. If you need help quitting, ask your health care provider. Do not use drugs. If you are sexually active, practice safe sex. Use a condom or other form of protection to prevent STIs. Take aspirin only as told by your health care provider. Make sure that you understand how much to take and what form to take. Work with your health care provider to find out whether it is safe and beneficial for you to take aspirin daily. Find healthy ways to manage stress, such as: Meditation, yoga, or listening to music. Journaling. Talking to a trusted person. Spending time with friends and family. Minimize exposure to UV radiation to reduce your risk of skin cancer. Safety Always wear your seat belt while driving or riding in a vehicle. Do not drive: If you have been drinking alcohol. Do not ride with someone who has been drinking. When you are tired or distracted. While texting. If you have been using any mind-altering substances or drugs. Wear a helmet and other protective equipment during sports activities. If you have firearms in your house, make sure you follow all gun safety procedures. What's next? Go to your health care provider once a year for an annual wellness visit. Ask your health care provider how often you should have your eyes and teeth checked. Stay up to date on all vaccines. This information is not intended to replace advice given to you by your health care provider. Make sure you discuss any questions you have with your health care provider. Document Revised: 06/03/2021 Document Reviewed: 06/03/2021 Elsevier Patient Education  2022 [employer].

## 2021-10-23 NOTE — Progress Notes (Signed)
Chief Complaint:  Eddie Mckay is a 40 y.o. male who presents today for his annual comprehensive physical exam.    Assessment/Plan:  Chronic Problems Addressed Today: Nicotine dependence with current use Patient was asked about his tobacco use today and was strongly advised to quit. Patient is currently contemplative. We reviewed treatment options to assist him quit smoking including NRT, Chantix, and Bupropion. Follow up at next office visit.   Total time spent counseling approximately 3 minutes.    Dyslipidemia Check labs.    Body mass index is 29.67 kg/m. / Overweight  BMI Metric Follow Up - 10/23/21 1044       BMI Metric Follow Up-Please document annually   BMI Metric Follow Up Education provided             Preventative Healthcare: Check Labs. UTD on vaccines.   Patient Counseling(The following topics were reviewed and/or handout was given):  -Nutrition: Stressed importance of moderation in sodium/caffeine intake, saturated fat and cholesterol, caloric balance, sufficient intake of fresh fruits, vegetables, and fiber.  -Stressed the importance of regular exercise.   -Substance Abuse: Discussed cessation/primary prevention of tobacco, alcohol, or other drug use; driving or other dangerous activities under the influence; availability of treatment for abuse.   -Injury prevention: Discussed safety belts, safety helmets, smoke detector, smoking near bedding or upholstery.   -Sexuality: Discussed sexually transmitted diseases, partner selection, use of condoms, avoidance of unintended pregnancy and contraceptive alternatives.   -Dental health: Discussed importance of regular tooth brushing, flossing, and dental visits.  -Health maintenance and immunizations reviewed. Please refer to Health maintenance section.  Return to care in 1 year for next preventative visit.     Subjective:  HPI:  He has no acute complaints today.   In regards to smoking, he has attempted  multiple times to quit without being able to fully resolve.   Lifestyle Diet: Can improve diet by reducing carb intake, incorporating fruit and vegetables Exercise: Karate 2x a week  Depression screen PHQ 2/9 10/23/2021  Decreased Interest 0  Down, Depressed, Hopeless 0  PHQ - 2 Score 0    Health Maintenance Due  Topic Date Due   Hepatitis C Screening  Never done     ROS: Per HPI, otherwise a complete review of systems was negative.   PMH:  The following were reviewed and entered/updated in epic: History reviewed. No pertinent past medical history. Patient Active Problem List   Diagnosis Date Noted   Dyslipidemia 05/12/2020   Nicotine dependence with current use 05/12/2020   Past Surgical History:  Procedure Laterality Date   ELBOW ARTHROSCOPY Right    When he was in third grade had elbow dislocation    Family History  Problem Relation Age of Onset   Arthritis Mother    Hypertension Mother    Diabetes Father    Hypertension Father     Medications- reviewed and updated No current outpatient medications on file.   No current facility-administered medications for this visit.    Allergies-reviewed and updated No Known Allergies  Social History   Socioeconomic History   Marital status: Married    Spouse name: Not on file   Number of children: 2   Years of education: Not on file   Highest education level: Not on file  Occupational History   Not on file  Tobacco Use   Smoking status: Light Smoker    Types: Cigarettes   Smokeless tobacco: Never  Vaping Use   Vaping Use: Never used  Substance and Sexual Activity   Alcohol use: Yes    Comment: Occasionall   Drug use: Never   Sexual activity: Not on file  Other Topics Concern   Not on file  Social History Narrative   Not on file   Social Determinants of Health   Financial Resource Strain: Not on file  Food Insecurity: Not on file  Transportation Needs: Not on file  Physical Activity: Not on file   Stress: Not on file  Social Connections: Not on file        Objective:  Physical Exam: BP 131/87   Pulse 87   Temp 98.3 F (36.8 C)   Ht 5\' 2"  (1.575 m)   Wt 162 lb 3.2 oz (73.6 kg)   SpO2 99%   BMI 29.67 kg/m   Body mass index is 29.67 kg/m. Wt Readings from Last 3 Encounters:  10/23/21 162 lb 3.2 oz (73.6 kg)  07/15/20 151 lb 3.2 oz (68.6 kg)  05/12/20 151 lb 3.2 oz (68.6 kg)   Gen: NAD, resting comfortably HEENT: TMs normal bilaterally. OP clear. No thyromegaly noted.  CV: RRR with no murmurs appreciated Pulm: NWOB, CTAB with no crackles, wheezes, or rhonchi GI: Normal bowel sounds present. Soft, Nontender, Nondistended. MSK: no edema, cyanosis, or clubbing noted Skin: warm, dry Neuro: CN2-12 grossly intact. Strength 5/5 in upper and lower extremities. Reflexes symmetric and intact bilaterally.  Psych: Normal affect and thought content     I,Jordan Kelly,acting as a scribe for 05/14/20, MD.,have documented all relevant documentation on the behalf of Jacquiline Doe, MD,as directed by  Jacquiline Doe, MD while in the presence of Jacquiline Doe, MD.  I, Jacquiline Doe, MD, have reviewed all documentation for this visit. The documentation on 10/23/21 for the exam, diagnosis, procedures, and orders are all accurate and complete.  13/04/22. Katina Degree, MD 10/23/2021 10:44 AM

## 2021-10-26 LAB — HEPATITIS C ANTIBODY
Hepatitis C Ab: NONREACTIVE
SIGNAL TO CUT-OFF: 0.06 (ref <1.00)

## 2021-10-27 NOTE — Progress Notes (Signed)
Please inform patient of the following:  His cholesterol is elevated compared to last year.  He did not need to start meds but he should continue working on diet and exercise.  We can recheck in about a year.  Everything else is NORMAL.

## 2021-11-09 ENCOUNTER — Encounter: Payer: Self-pay | Admitting: Family Medicine

## 2021-11-10 NOTE — Telephone Encounter (Signed)
Patient aware no labs found in chart Will call insurance first before ordering labs

## 2021-11-16 ENCOUNTER — Encounter: Payer: Self-pay | Admitting: Family Medicine

## 2022-05-21 ENCOUNTER — Encounter: Payer: Self-pay | Admitting: Family Medicine

## 2022-05-21 ENCOUNTER — Ambulatory Visit (INDEPENDENT_AMBULATORY_CARE_PROVIDER_SITE_OTHER): Payer: 59 | Admitting: Family Medicine

## 2022-05-21 VITALS — BP 94/60 | Temp 98.3°F | Ht 62.0 in | Wt 134.4 lb

## 2022-05-21 DIAGNOSIS — E785 Hyperlipidemia, unspecified: Secondary | ICD-10-CM

## 2022-05-21 DIAGNOSIS — R42 Dizziness and giddiness: Secondary | ICD-10-CM

## 2022-05-21 NOTE — Patient Instructions (Addendum)
It was very nice to see you today!  We will check blood work today.  You are probably dehydrated.  Please make sure that you are getting plenty of fluids.  Let us know if your dizziness is not improving.  I think you probably have a small injury to your rotator cuff.  Please work on the exercises and let us know if not improving.  Take care, Dr Jerline Pain  PLEASE NOTE:  If you had any lab tests please let us know if you have not heard back within a few days. You may see your results on mychart before we have a chance to review them but we will give you a call once they are reviewed by [place]. If we ordered any referrals today, please let us know if you have not heard from their office within the next week.   Please try these tips to maintain a healthy lifestyle:  Eat at least 3 REAL meals and 1-2 snacks per day.  Aim for no more than 5 hours between eating.  If you eat breakfast, please do so within one hour of getting up.   Each meal should contain half fruits/vegetables, one quarter protein, and one quarter carbs (no bigger than a computer mouse)  Cut down on sweet beverages. This includes juice, soda, and sweet tea.   Drink at least 1 glass of water with each meal and aim for at least 8 glasses per day  Exercise at least 150 minutes every week.

## 2022-05-21 NOTE — Progress Notes (Signed)
   Eddie Mckay is a 41 y.o. male who presents today for an office visit.  Assessment/Plan:  New/Acute Problems: Dizziness No red flags.  Reassuring neuro exam today.  Did have about a 14 point drop in systolic blood pressure going from lying to standing and a point drop in diastolic blood pressure.  May be dehydrated.  He would like to come back to check labs.  We encouraged good hydration.  He is also lost about 30 pounds in the last 6 months which could be contributing as well.  He will try to push fluids for the next several days and let us know if not improving over the next couple of weeks.    Right Shoulder Pain No red flags.  Consistent with rotator cuff strain.  We discussed anti-inflammatories however he declined.  We discussed home exercise program and handout was given.  He will let me know if not improving.  He can use OTC meds as needed.   Chronic Problems Addressed Today: Dyslipidemia He will come back to check labs.  He has lost about 30 pounds through diet and exercise over the last 6 months.  Congratulated patient on weight loss.     Subjective:  HPI:  Patient here with intermittent dizziness. This has been going on for several weeks.  He started doing a low carb diet about 6 weeks ago. He has been working on his exercise as well. In the evenings he noticed that when he goes from sitting to standing he will feel dizzy. Some days are worse than others. Lasts for 2-3 seconds and then it goes away. Symptoms seem to be stable. No headache. No weakness or numbness. He has been drinking plenty of water and drinks about 10 glasses of water per day.   He has also had some pain his right shoulder. He has been playing cricket more and feels like he may have strained something in his right shoulder.        Objective:  Physical Exam: BP 94/60   Temp 98.3 F (36.8 C) (Temporal)   Ht 5\' 2"  (1.575 m)   Wt 134 lb 6.4 oz (61 kg)   BMI 24.58 kg/m  Wt Readings from Last 3  Encounters:  05/21/22 134 lb 6.4 oz (61 kg)  10/23/21 162 lb 3.2 oz (73.6 kg)  07/15/20 151 lb 3.2 oz (68.6 kg)     Gen: No acute distress, resting comfortably CV: Regular rate and rhythm with no murmurs appreciated Pulm: Normal work of breathing, clear to auscultation bilaterally with no crackles, wheezes, or rhonchi MSK: - Right Shoulder: No deformities.  Nontender to palpation.  No pain with resisted supraspinatus or internal or external rotation.  Positive Neer and Hawkins test. Neuro: Grossly normal, moves all extremities Psych: Normal affect and thought content      Zulay Corrie M. 07/17/20, MD 05/21/2022 2:43 PM

## 2022-05-21 NOTE — Assessment & Plan Note (Signed)
He will come back to check labs.  He has lost about 30 pounds through diet and exercise over the last 6 months.  Congratulated patient on weight loss.

## 2022-05-28 ENCOUNTER — Other Ambulatory Visit (INDEPENDENT_AMBULATORY_CARE_PROVIDER_SITE_OTHER): Payer: 59

## 2022-05-28 DIAGNOSIS — R42 Dizziness and giddiness: Secondary | ICD-10-CM

## 2022-05-28 LAB — COMPREHENSIVE METABOLIC PANEL
ALT: 18 U/L (ref 0–53)
AST: 16 U/L (ref 0–37)
Albumin: 4.5 g/dL (ref 3.5–5.2)
Alkaline Phosphatase: 58 U/L (ref 39–117)
BUN: 17 mg/dL (ref 6–23)
CO2: 27 mEq/L (ref 19–32)
Calcium: 9.3 mg/dL (ref 8.4–10.5)
Chloride: 103 mEq/L (ref 96–112)
Creatinine, Ser: 0.8 mg/dL (ref 0.40–1.50)
GFR: 109.28 mL/min (ref >60.00)
Glucose, Bld: 91 mg/dL (ref 70–99)
Potassium: 4.2 mEq/L (ref 3.5–5.1)
Sodium: 137 mEq/L (ref 135–145)
Total Bilirubin: 0.5 mg/dL (ref 0.2–1.2)
Total Protein: 7.3 g/dL (ref 6.0–8.3)

## 2022-05-28 LAB — CBC
HCT: 40.8 % (ref 39.0–52.0)
Hemoglobin: 13.8 g/dL (ref 13.0–17.0)
MCHC: 33.8 g/dL (ref 30.0–36.0)
MCV: 82.5 fl (ref 78.0–100.0)
Platelets: 228 10*3/uL (ref 150.0–400.0)
RBC: 4.94 Mil/uL (ref 4.22–5.81)
RDW: 13.5 % (ref 11.5–15.5)
WBC: 6.4 10*3/uL (ref 4.0–10.5)

## 2022-05-28 LAB — TSH: TSH: 2.39 u[IU]/mL (ref 0.35–5.50)

## 2022-05-28 LAB — LIPID PANEL
Cholesterol: 267 mg/dL — ABNORMAL HIGH (ref 0–200)
HDL: 54 mg/dL (ref >39.00)
LDL Cholesterol: 199 mg/dL — ABNORMAL HIGH (ref 0–99)
NonHDL: 212.95
Total CHOL/HDL Ratio: 5
Triglycerides: 69 mg/dL (ref 0.0–149.0)
VLDL: 13.8 mg/dL (ref 0.0–40.0)

## 2022-05-31 NOTE — Progress Notes (Signed)
Please inform patient of the following:  His cholesterol is up from last year. It would be reasonable to start a cholesterol medication to improve his numbers and lower risk of heart attack and stroke. Please send in lipitor 10mg  once daily if he is agreeable. Everything else is NORMAL. We can recheck everything in a year.  Eddie Mckay. Jerline Pain, MD 05/31/2022 5:41 PM

## 2022-08-03 ENCOUNTER — Ambulatory Visit: Payer: 59 | Admitting: Family Medicine

## 2022-08-13 ENCOUNTER — Ambulatory Visit (INDEPENDENT_AMBULATORY_CARE_PROVIDER_SITE_OTHER): Payer: 59 | Admitting: Family Medicine

## 2022-08-13 ENCOUNTER — Telehealth: Payer: Self-pay | Admitting: Family Medicine

## 2022-08-13 ENCOUNTER — Encounter: Payer: Self-pay | Admitting: Family Medicine

## 2022-08-13 VITALS — BP 113/76 | HR 74 | Temp 97.8°F | Ht 62.0 in | Wt 129.0 lb

## 2022-08-13 DIAGNOSIS — Z23 Encounter for immunization: Secondary | ICD-10-CM | POA: Diagnosis not present

## 2022-08-13 DIAGNOSIS — F17200 Nicotine dependence, unspecified, uncomplicated: Secondary | ICD-10-CM | POA: Diagnosis not present

## 2022-08-13 DIAGNOSIS — E785 Hyperlipidemia, unspecified: Secondary | ICD-10-CM | POA: Diagnosis not present

## 2022-08-13 NOTE — Assessment & Plan Note (Signed)
Patient has stopped smoking since his last visit.  Congratulated patient.  Encouraged continued cessation.

## 2022-08-13 NOTE — Patient Instructions (Signed)
It was very nice to see you today!  You are doing a great job.  Please keep up the good work.  We will give your flu vaccine today.  We will see back in a few months for your physical.  Please come back sooner if needed.  Take care, Dr Jimmey Ralph  PLEASE NOTE:  If you had any lab tests please let us know if you have not heard back within a few days. You may see your results on mychart before we have a chance to review them but we will give you a call once they are reviewed by [place]. If we ordered any referrals today, please let us know if you have not heard from their office within the next week.   Please try these tips to maintain a healthy lifestyle:  Eat at least 3 REAL meals and 1-2 snacks per day.  Aim for no more than 5 hours between eating.  If you eat breakfast, please do so within one hour of getting up.   Each meal should contain half fruits/vegetables, one quarter protein, and one quarter carbs (no bigger than a computer mouse)  Cut down on sweet beverages. This includes juice, soda, and sweet tea.   Drink at least 1 glass of water with each meal and aim for at least 8 glasses per day  Exercise at least 150 minutes every week.

## 2022-08-13 NOTE — Progress Notes (Signed)
   Eddie Mckay is a 41 y.o. male who presents today for an office visit.  Assessment/Plan:  Chronic Problems Addressed Today: Dyslipidemia Last LDL significantly elevated to 190s.  We discussed starting a cholesterol medication however deferred.  He is made good changes with diet and exercise.  We can recheck again and come back in for lipid panel at his CPE in a couple of months.  Nicotine dependence with current use Patient has stopped smoking since his last visit.  Congratulated patient.  Encouraged continued cessation.  Flu vaccine given today.    Subjective:  HPI:  See A/p for status of chronic conditions.    Since our last visit he has made significant changes with diet and exercise. He has also stopped smoking.        Objective:  Physical Exam: BP 113/76   Pulse 74   Temp 97.8 F (36.6 C) (Temporal)   Ht 5\' 2"  (1.575 m)   Wt 129 lb (58.5 kg)   SpO2 97%   BMI 23.59 kg/m   Gen: No acute distress, resting comfortably Neuro: Grossly normal, moves all extremities Psych: Normal affect and thought content      Eddie Mckay M. , MD 08/13/2022 3:04 PM

## 2022-08-13 NOTE — Assessment & Plan Note (Signed)
Last LDL significantly elevated to 190s.  We discussed starting a cholesterol medication however deferred.  He is made good changes with diet and exercise.  We can recheck again and come back in for lipid panel at his CPE in a couple of months.

## 2022-08-13 NOTE — Telephone Encounter (Signed)
Pt is stating he would like to have his labs ordered before his physical. He would like to be able to discuss results during his appt. Please advise

## 2022-08-16 NOTE — Telephone Encounter (Signed)
Please advise 

## 2022-08-17 ENCOUNTER — Other Ambulatory Visit: Payer: Self-pay | Admitting: *Deleted

## 2022-08-17 DIAGNOSIS — E785 Hyperlipidemia, unspecified: Secondary | ICD-10-CM

## 2022-08-17 NOTE — Telephone Encounter (Signed)
Ok to schedule lab appointment before [facility]  Future labs order

## 2022-08-17 NOTE — Telephone Encounter (Signed)
Ok to order same labs as last year.  Katina Degree. Jimmey Ralph, MD 08/17/2022 8:54 AM

## 2022-10-20 ENCOUNTER — Other Ambulatory Visit: Payer: 59

## 2022-10-25 ENCOUNTER — Encounter: Payer: 59 | Admitting: Family Medicine

## 2022-11-24 ENCOUNTER — Other Ambulatory Visit (INDEPENDENT_AMBULATORY_CARE_PROVIDER_SITE_OTHER): Payer: 59

## 2022-11-24 DIAGNOSIS — E785 Hyperlipidemia, unspecified: Secondary | ICD-10-CM | POA: Diagnosis not present

## 2022-11-24 LAB — COMPREHENSIVE METABOLIC PANEL
ALT: 15 U/L (ref 0–53)
AST: 15 U/L (ref 0–37)
Albumin: 4.7 g/dL (ref 3.5–5.2)
Alkaline Phosphatase: 53 U/L (ref 39–117)
BUN: 20 mg/dL (ref 6–23)
CO2: 27 mEq/L (ref 19–32)
Calcium: 9 mg/dL (ref 8.4–10.5)
Chloride: 103 mEq/L (ref 96–112)
Creatinine, Ser: 0.93 mg/dL (ref 0.40–1.50)
GFR: 101.04 mL/min (ref >60.00)
Glucose, Bld: 89 mg/dL (ref 70–99)
Potassium: 4.2 mEq/L (ref 3.5–5.1)
Sodium: 137 mEq/L (ref 135–145)
Total Bilirubin: 0.5 mg/dL (ref 0.2–1.2)
Total Protein: 7.3 g/dL (ref 6.0–8.3)

## 2022-11-24 LAB — LIPID PANEL
Cholesterol: 274 mg/dL — ABNORMAL HIGH (ref 0–200)
HDL: 60.7 mg/dL (ref >39.00)
LDL Cholesterol: 200 mg/dL — ABNORMAL HIGH (ref 0–99)
NonHDL: 213.27
Total CHOL/HDL Ratio: 5
Triglycerides: 64 mg/dL (ref 0.0–149.0)
VLDL: 12.8 mg/dL (ref 0.0–40.0)

## 2022-11-24 LAB — CBC
HCT: 41.4 % (ref 39.0–52.0)
Hemoglobin: 14.4 g/dL (ref 13.0–17.0)
MCHC: 34.9 g/dL (ref 30.0–36.0)
MCV: 82.1 fl (ref 78.0–100.0)
Platelets: 222 10*3/uL (ref 150.0–400.0)
RBC: 5.04 Mil/uL (ref 4.22–5.81)
RDW: 13.4 % (ref 11.5–15.5)
WBC: 7.7 10*3/uL (ref 4.0–10.5)

## 2022-11-24 LAB — HEMOGLOBIN A1C: Hgb A1c MFr Bld: 5.5 % (ref 4.6–6.5)

## 2022-11-24 LAB — TSH: TSH: 2.55 u[IU]/mL (ref 0.35–5.50)

## 2022-11-24 LAB — PSA: PSA: 0.28 ng/mL (ref 0.10–4.00)

## 2022-11-26 NOTE — Progress Notes (Signed)
Please inform patient of the following:  Cholesterol still significantly elevated.  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  Recommend starting cholesterol medication to improve his numbers and lower risk of heart attack and stroke.  Please send in Lipitor 40 mg daily if he is agreeable to start.  We should recheck again in 6 to 12 months.  If he wishes to discuss alternatives we can refer him to the advanced lipid clinic with cardiology.

## 2022-12-02 ENCOUNTER — Encounter: Payer: 59 | Admitting: Family Medicine

## 2022-12-03 ENCOUNTER — Encounter: Payer: Self-pay | Admitting: Family Medicine

## 2022-12-03 ENCOUNTER — Ambulatory Visit (INDEPENDENT_AMBULATORY_CARE_PROVIDER_SITE_OTHER): Payer: 59 | Admitting: Family Medicine

## 2022-12-03 VITALS — BP 126/79 | HR 93 | Temp 98.0°F | Ht 62.0 in | Wt 136.6 lb

## 2022-12-03 DIAGNOSIS — E785 Hyperlipidemia, unspecified: Secondary | ICD-10-CM | POA: Diagnosis not present

## 2022-12-03 DIAGNOSIS — F17200 Nicotine dependence, unspecified, uncomplicated: Secondary | ICD-10-CM | POA: Diagnosis not present

## 2022-12-03 DIAGNOSIS — Z0001 Encounter for general adult medical examination with abnormal findings: Secondary | ICD-10-CM

## 2022-12-03 NOTE — Patient Instructions (Signed)
It was very nice to see you today!  I will refer you to see a nutritionist.  Please keep up the great work with your diet and exercise.  Please come back in a few months to recheck blood work.  I will see you back in about a year for your next physical.  Come back sooner if needed.  Take care, Dr Jimmey Ralph  PLEASE NOTE:  If you had any lab tests, please let us know if you have not heard back within a few days. You may see your results on mychart before we have a chance to review them but we will give you a call once they are reviewed by [place].   If we ordered any referrals today, please let us know if you have not heard from their office within the next week.   If you had any urgent prescriptions sent in today, please check with the pharmacy within an hour of our visit to make sure the prescription was transmitted appropriately.   Please try these tips to maintain a healthy lifestyle:  Eat at least 3 REAL meals and 1-2 snacks per day.  Aim for no more than 5 hours between eating.  If you eat breakfast, please do so within one hour of getting up.   Each meal should contain half fruits/vegetables, one quarter protein, and one quarter carbs (no bigger than a computer mouse)  Cut down on sweet beverages. This includes juice, soda, and sweet tea.   Drink at least 1 glass of water with each meal and aim for at least 8 glasses per day  Exercise at least 150 minutes every week.    Preventive Care 21-81 Years Old, Male Preventive care refers to lifestyle choices and visits with your health care provider that can promote health and wellness. Preventive care visits are also called wellness exams. What can I expect for my preventive care visit? Counseling During your preventive care visit, your health care provider may ask about your: Medical history, including: Past medical problems. Family medical history. Current health, including: Emotional well-being. Home life and relationship  well-being. Sexual activity. Lifestyle, including: Alcohol, nicotine or tobacco, and drug use. Access to firearms. Diet, exercise, and sleep habits. Safety issues such as seatbelt and bike helmet use. Sunscreen use. Work and work Astronomer. Physical exam Your health care provider may check your: Height and weight. These may be used to calculate your BMI (body mass index). BMI is a measurement that tells if you are at a healthy weight. Waist circumference. This measures the distance around your waistline. This measurement also tells if you are at a healthy weight and may help predict your risk of certain diseases, such as type 2 diabetes and high blood pressure. Heart rate and blood pressure. Body temperature. Skin for abnormal spots. What immunizations do I need?  Vaccines are usually given at various ages, according to a schedule. Your health care provider will recommend vaccines for you based on your age, medical history, and lifestyle or other factors, such as travel or where you work. What tests do I need? Screening Your health care provider may recommend screening tests for certain conditions. This may include: Lipid and cholesterol levels. Diabetes screening. This is done by checking your blood sugar (glucose) after you have not eaten for a while (fasting). Hepatitis B test. Hepatitis C test. HIV (human immunodeficiency virus) test. STI (sexually transmitted infection) testing, if you are at risk. Talk with your health care provider about your test results, treatment  options, and if necessary, the need for more tests. Follow these instructions at home: Eating and drinking  Eat a healthy diet that includes fresh fruits and vegetables, whole grains, lean protein, and low-fat dairy products. Drink enough fluid to keep your urine pale yellow. Take vitamin and mineral supplements as recommended by your health care provider. Do not drink alcohol if your health care provider tells  you not to drink. If you drink alcohol: Limit how much you have to 0-2 drinks a day. Know how much alcohol is in your drink. In the [place] one drink equals one 12 oz bottle of beer (355 mL), one 5 oz glass of wine (148 mL), or one 1 oz glass of hard liquor (44 mL). Lifestyle Brush your teeth every morning and night with fluoride toothpaste. Floss one time each day. Exercise for at least 30 minutes 5 or more days each week. Do not use any products that contain nicotine or tobacco. These products include cigarettes, chewing tobacco, and vaping devices, such as e-cigarettes. If you need help quitting, ask your health care provider. Do not use drugs. If you are sexually active, practice safe sex. Use a condom or other form of protection to prevent STIs. Find healthy ways to manage stress, such as: Meditation, yoga, or listening to music. Journaling. Talking to a trusted person. Spending time with friends and family. Minimize exposure to UV radiation to reduce your risk of skin cancer. Safety Always wear your seat belt while driving or riding in a vehicle. Do not drive: If you have been drinking alcohol. Do not ride with someone who has been drinking. If you have been using any mind-altering substances or drugs. While texting. When you are tired or distracted. Wear a helmet and other protective equipment during sports activities. If you have firearms in your house, make sure you follow all gun safety procedures. Seek help if you have been physically or sexually abused. What's next? Go to your health care provider once a year for an annual wellness visit. Ask your health care provider how often you should have your eyes and teeth checked. Stay up to date on all vaccines. This information is not intended to replace advice given to you by your health care provider. Make sure you discuss any questions you have with your health care provider. Document Revised: 06/03/2021 Document Reviewed:  06/03/2021 Elsevier Patient Education  2023 [employer].

## 2022-12-03 NOTE — Assessment & Plan Note (Signed)
Congratulated patient on cessation. 

## 2022-12-03 NOTE — Assessment & Plan Note (Signed)
Had lengthy discussion with patient regarding his most recent lipid panel showed LDL 200.  We did discuss starting cholesterol medication however he would like to defer for now.  He has made excellent changes with diet and exercise and would like to continue this.  We did discuss increased risk for cardiovascular outcomes with contributed dyslipidemia.  His ratio is reasonable at 5:1.  We will also place referral for him to see nutritionist per his request.  Does not have a family history of high cholesterol -unsure if this represents true familial hypercholesterolemia.  We did discuss with patient that he should ideally try to get his LDL less than 190.  He will come back in 3 months to recheck lipids.  If still elevated will consider starting high intensity statin such as Lipitor 40 mg daily.

## 2022-12-03 NOTE — Progress Notes (Signed)
Chief Complaint:  Eddie Mckay is a 41 y.o. male who presents today for his annual comprehensive physical exam.    Assessment/Plan:  Chronic Problems Addressed Today: Dyslipidemia Had lengthy discussion with patient regarding his most recent lipid panel showed LDL 200.  We did discuss starting cholesterol medication however he would like to defer for now.  He has made excellent changes with diet and exercise and would like to continue this.  We did discuss increased risk for cardiovascular outcomes with contributed dyslipidemia.  His ratio is reasonable at 5:1.  We will also place referral for him to see nutritionist per his request.  Does not have a family history of high cholesterol -unsure if this represents true familial hypercholesterolemia.  We did discuss with patient that he should ideally try to get his LDL less than 190.  He will come back in 3 months to recheck lipids.  If still elevated will consider starting high intensity statin such as Lipitor 40 mg daily.  Nicotine dependence with current use Congratulated patient on cessation.  Preventative Healthcare: No up-to-date on vaccines and screenings.  Patient Counseling(The following topics were reviewed and/or handout was given):  -Nutrition: Stressed importance of moderation in sodium/caffeine intake, saturated fat and cholesterol, caloric balance, sufficient intake of fresh fruits, vegetables, and fiber.  -Stressed the importance of regular exercise.   -Substance Abuse: Discussed cessation/primary prevention of tobacco, alcohol, or other drug use; driving or other dangerous activities under the influence; availability of treatment for abuse.   -Injury prevention: Discussed safety belts, safety helmets, smoke detector, smoking near bedding or upholstery.   -Sexuality: Discussed sexually transmitted diseases, partner selection, use of condoms, avoidance of unintended pregnancy and contraceptive alternatives.   -Dental health:  Discussed importance of regular tooth brushing, flossing, and dental visits.  -Health maintenance and immunizations reviewed. Please refer to Health maintenance section.  Return to care in 1 year for next preventative visit.     Subjective:  HPI:  He has no acute complaints today.   Lifestyle Diet: Trying to get more fruits and vegetables.  Exercise: More weight training. Goes to [facility] frequently. 10-15 minutes of cardio at a time.      12/03/2022    1:46 PM  Depression screen PHQ 2/9  Decreased Interest 0  Down, Depressed, Hopeless 0  PHQ - 2 Score 0    There are no preventive care reminders to display for this patient.   ROS: Per HPI, otherwise a complete review of systems was negative.   PMH:  The following were reviewed and entered/updated in epic: History reviewed. No pertinent past medical history. Patient Active Problem List   Diagnosis Date Noted   Dyslipidemia 05/12/2020   Nicotine dependence with current use 05/12/2020   Past Surgical History:  Procedure Laterality Date   ELBOW ARTHROSCOPY Right    When he was in third grade had elbow dislocation    Family History  Problem Relation Age of Onset   Arthritis Mother    Hypertension Mother    Diabetes Father    Hypertension Father     Medications- reviewed and updated No current outpatient medications on file.   No current facility-administered medications for this visit.    Allergies-reviewed and updated No Known Allergies  Social History   Socioeconomic History   Marital status: Married    Spouse name: Not on file   Number of children: 2   Years of education: Not on file   Highest education level: Not on file  Occupational History   Not on file  Tobacco Use   Smoking status: Former    Types: Cigarettes   Smokeless tobacco: Never  Vaping Use   Vaping Use: Never used  Substance and Sexual Activity   Alcohol use: Yes    Comment: Occasionall   Drug use: Never   Sexual activity: Not on  file  Other Topics Concern   Not on file  Social History Narrative   Not on file   Social Determinants of Health   Financial Resource Strain: Not on file  Food Insecurity: Not on file  Transportation Needs: Not on file  Physical Activity: Not on file  Stress: Not on file  Social Connections: Not on file        Objective:  Physical Exam: BP 126/79   Pulse 93   Temp 98 F (36.7 C) (Temporal)   Ht 5\' 2"  (1.575 m)   Wt 136 lb 9.6 oz (62 kg)   SpO2 96%   BMI 24.98 kg/m   Body mass index is 24.98 kg/m. Wt Readings from Last 3 Encounters:  12/03/22 136 lb 9.6 oz (62 kg)  08/13/22 129 lb (58.5 kg)  05/21/22 134 lb 6.4 oz (61 kg)   Gen: NAD, resting comfortably HEENT: TMs normal bilaterally. OP clear. No thyromegaly noted.  CV: RRR with no murmurs appreciated Pulm: NWOB, CTAB with no crackles, wheezes, or rhonchi GI: Normal bowel sounds present. Soft, Nontender, Nondistended. MSK: no edema, cyanosis, or clubbing noted Skin: warm, dry Neuro: CN2-12 grossly intact. Strength 5/5 in upper and lower extremities. Reflexes symmetric and intact bilaterally.  Psych: Normal affect and thought content     Shmuel Girgis M. 07/21/22, MD 12/03/2022 2:27 PM

## 2023-01-17 ENCOUNTER — Ambulatory Visit: Payer: 59 | Admitting: Dietician

## 2023-02-10 ENCOUNTER — Encounter: Payer: Self-pay | Admitting: Dietician

## 2023-02-10 ENCOUNTER — Encounter: Payer: 59 | Attending: Family Medicine | Admitting: Dietician

## 2023-02-10 VITALS — Ht 63.0 in | Wt 135.0 lb

## 2023-02-10 DIAGNOSIS — E785 Hyperlipidemia, unspecified: Secondary | ICD-10-CM | POA: Diagnosis not present

## 2023-02-10 NOTE — Progress Notes (Signed)
Medical Nutrition Therapy  Appointment Start time:  [phone]  Appointment End time:  0831  Primary concerns today: Pt is concerned about his high cholesterol.    Referral diagnosis: dyslipidemia Preferred learning style: no preference indicated Learning readiness: ready   NUTRITION ASSESSMENT   Anthropometrics  Ht: 63 in Wt: 135 lbs  Clinical Medical Hx: reviewed Medications: reviewed Labs: 11/24/22 cholesterol 274, LDL 200.  Notable Signs/Symptoms: none reported Food Allergies: none reported  Lifestyle & Dietary Hx This is an abbreviated visit due to pt being late.   Pt does Building control surveyor for work.   Pt states he moved to the [place] from [place] in 2017. He states he notices his diet changed a lot when he moved to the [place] and also that he became more sedentary.   Pt reports he was 165 lbs a few years ago and is now 135 lbs.   Pt states he started a low carb diet in 2022. He reports he was really strict until last July but now he eats around 35g of carbohydrates per day.   Pt has chicken 4x/wk, fish 2x/wk and 1x/wk has beef or paneer.   Pt states he quit smoking June 2023, but picked it back up this month with plans to quit again.   Pt reports he notices his cholesterol went up following his low carb diet.   Pt uses coconut oil for all of his cooking.  Estimated daily fluid intake: 48 oz Supplements: none reported Sleep: 6 hours Stress / self-care: moderate stress Current average weekly physical activity: 3x/wk 15 minutes cardio and 30 minutes weight.   24-Hr Dietary Recall First Meal: 2 boiled eggs Snack: almonds Second Meal: vegetables and chicken or fish Snack: almonds Third Meal: salad with protein Snack: none Beverages: 3 bottles water, black coffee 2-3 cups.    NUTRITION DIAGNOSIS  [place]-2.2 Altered nutrition-related laboratory As related to dyslipidemia.  As evidenced by cholesterol 274 and LDL 200.   NUTRITION INTERVENTION  Nutrition education (E-1) on the  following topics:  LDL lowering nutrition therapy Impact of soluble fiber on cholesterol Saturated vs unsaturated fat Smoke points of cooking oils MyPlate Whole vs refined grains Building balanced meals and snacks Importance of stress management and adequate sleep Benefits of physical activity  Handouts Provided Include  Dish Up a Healthy Meal Meal Ideas Nutrition Care Manual: LDL-Lowering Nutrition Therapy  Learning Style & Readiness for Change Teaching method utilized: Visual & Auditory  Demonstrated degree of understanding via: Teach Back  Barriers to learning/adherence to lifestyle change: none  Goals Established by Pt Aim for 200 minutes of physical activity weekly. Goal: continue to workout for 45 minutes 3x/wk. Add a workout/walk on the weekend.   Goal: switch from coconut oil to avocado/olive oil.  Goal: Implement whole grains in your diet.    MONITORING & EVALUATION Dietary intake, weekly physical activity, and follow up in 3-4 months.  Next Steps  Patient is to call for questions.

## 2023-02-10 NOTE — Patient Instructions (Addendum)
Aim for 200 minutes of physical activity weekly. Goal: continue to workout for 45 minutes 3x/wk. Add a workout/walk on the weekend.   Goal: switch from coconut oil to avocado/olive oil.  Goal: Implement whole grains in your diet.

## 2023-03-17 ENCOUNTER — Other Ambulatory Visit: Payer: 59

## 2023-05-30 ENCOUNTER — Other Ambulatory Visit (INDEPENDENT_AMBULATORY_CARE_PROVIDER_SITE_OTHER): Payer: 59

## 2023-05-30 DIAGNOSIS — E785 Hyperlipidemia, unspecified: Secondary | ICD-10-CM | POA: Diagnosis not present

## 2023-05-30 LAB — VITAMIN B12: Vitamin B-12: 441 pg/mL (ref 211–911)

## 2023-05-30 LAB — VITAMIN D 25 HYDROXY (VIT D DEFICIENCY, FRACTURES): VITD: 32.07 ng/mL (ref 30.00–100.00)

## 2023-05-31 ENCOUNTER — Encounter: Payer: Self-pay | Admitting: Family Medicine

## 2023-05-31 LAB — NMR, LIPOPROFILE
Cholesterol, Total: 248 mg/dL — ABNORMAL HIGH (ref 100–199)
HDL Particle Number: 31.7 umol/L (ref >=30.5)
HDL-C: 60 mg/dL (ref >39)
LDL Particle Number: 1445 nmol/L — ABNORMAL HIGH (ref <1000)
LDL Size: 21.9 nm (ref >20.5)
LDL-C (NIH Calc): 180 mg/dL — ABNORMAL HIGH (ref 0–99)
LP-IR Score: <25 (ref <=45)
Small LDL Particle Number: 238 nmol/L (ref <=527)
Triglycerides: 54 mg/dL (ref 0–149)

## 2023-05-31 NOTE — Telephone Encounter (Signed)
Please see pt msg and advise, no Lipid panel completed at recent visit.

## 2023-06-02 ENCOUNTER — Ambulatory Visit: Payer: 59 | Admitting: Dietician

## 2023-06-02 NOTE — Progress Notes (Signed)
Cholesterol numbers are improved slightly since her last visit.  His LDL is 180.  This is high but not at the point where we need to start medications.  He should continue to work on diet and exercise and we can recheck in 6 to 12 months.  Vitamin D and B12 levels are back to normal.

## 2023-06-02 NOTE — Telephone Encounter (Signed)
See result note. He had an NMR lipid profile test which includes the lipid panel.  Eddie Mckay. Jimmey Ralph, MD 06/02/2023 10:01 AM

## 2023-06-03 ENCOUNTER — Encounter: Payer: 59 | Attending: Family Medicine | Admitting: Dietician

## 2023-06-03 ENCOUNTER — Encounter: Payer: Self-pay | Admitting: Dietician

## 2023-06-03 DIAGNOSIS — E785 Hyperlipidemia, unspecified: Secondary | ICD-10-CM | POA: Diagnosis not present

## 2023-06-03 NOTE — Patient Instructions (Signed)
Previous Goals:  Aim for 200 minutes of physical activity weekly. Goal: continue to workout for 45 minutes 3x/wk. Add a workout/walk on the weekend. - in progress, continue.   Goal: switch from coconut oil to avocado/olive oil. - in progress, continue.   Goal: Implement whole grains in your diet. - in progress, continue.   New Goal:  Goal: Drink 4 water bottles daily.    Goal: include 1 whole grain every day.

## 2023-06-03 NOTE — Progress Notes (Signed)
Medical Nutrition Therapy  Appointment Start time:  38 Appointment End time:  1210  Primary concerns today: Pt is concerned about his high cholesterol.    Referral diagnosis: dyslipidemia Preferred learning style: no preference indicated Learning readiness: ready   NUTRITION ASSESSMENT   Anthropometrics  Ht: 63 in Wt 06/03/23: 128 lbs Wt 02/10/23: 135 lbs  Clinical Medical Hx: reviewed, HLD Medications: reviewed Labs: 11/24/22 cholesterol 274, LDL 200.  Notable Signs/Symptoms: none reported Food Allergies: none reported  Lifestyle & Dietary Hx  Pt is present today with his daughter.   Pt states he has started intermittent fasting.   Pt states he started incorporating some carbohydrates such as some rice.   Pt daughter states when she bakes sweets her dad (pt) eats a lot of them every time he sees them.   Pt states he switched his coconut oil to olive or avocado oil.   Pt states he has been going to the gym 2 days per week and karate 2 days per week.   Pt states he eats mostly chicken, fish, and has paneer once a week.   Estimated daily fluid intake: 48 oz Supplements: none reported Sleep: 6 hours Stress / self-care: moderate stress Current average weekly physical activity: 3x/wk 15 minutes cardio and 30 minutes weight.   24-Hr Dietary Recall First Meal: none Snack: none Second Meal: vegetables and chicken or fish Snack: peanuts Third Meal: salad with protein Snack: none Beverages: 3 bottles water, black coffee 2-3 cups.    NUTRITION DIAGNOSIS  [place]-2.2 Altered nutrition-related laboratory As related to dyslipidemia.  As evidenced by cholesterol 274 and LDL 200.   NUTRITION INTERVENTION  Nutrition education (E-1) on the following topics:  Soluble fiber and impact on lowering cholesterol: Sources: Oats, barley, beans, lentils, fruits (apples, oranges), vegetables (carrots, broccoli), and flaxseeds. Benefits: Soluble fiber reduces the absorption of cholesterol  into your bloodstream. Choose Healthy Unsaturated Fats and Omega 3's. Sources: Olive oil, canola oil, avocados, nuts, and Fatty fish (salmon, trout), walnuts, flaxseeds, and chia seeds. Benefits: Helps raise HDL cholesterol and lower LDL cholesterol. Omega-3s help reduce triglycerides and raise HDL cholesterol. Limit Saturated Fats: Sources: Red meat, full-fat dairy products, butter, and coconut oil. Benefits: Reducing intake helps lower LDL cholesterol levels. Eat Plenty of Fruits and Vegetables. Benefits: High in fiber and antioxidants, which help lower LDL cholesterol. Eat Whole Grains. Sources: Brown rice, whole wheat bread, quinoa, and whole grain pasta. Benefits: Whole grains contain more fiber and nutrients than refined grains. Regular Physical Activity: Aim for at least 30 minutes of moderate-intensity exercise most days of the week.   Handouts Provided Include  Plate Method  Learning Style & Readiness for Change Teaching method utilized: Visual & Auditory  Demonstrated degree of understanding via: Teach Back  Barriers to learning/adherence to lifestyle change: none  Goals Established by Pt  Previous Goals:  Aim for 200 minutes of physical activity weekly. Goal: continue to workout for 45 minutes 3x/wk. Add a workout/walk on the weekend. - in progress, continue.   Goal: switch from coconut oil to avocado/olive oil. - in progress, continue.   Goal: Implement whole grains in your diet. - in progress, continue.   New Goal:  Goal: Drink 4 water bottles daily.    Goal: include 1 whole grain every day.   MONITORING & EVALUATION Dietary intake, weekly physical activity, and follow up in 6 months.  Next Steps  Patient is to call for questions.

## 2023-07-25 DIAGNOSIS — K011 Impacted teeth: Secondary | ICD-10-CM | POA: Diagnosis not present

## 2023-07-29 DIAGNOSIS — K121 Other forms of stomatitis: Secondary | ICD-10-CM | POA: Diagnosis not present

## 2023-07-29 DIAGNOSIS — K136 Irritative hyperplasia of oral mucosa: Secondary | ICD-10-CM | POA: Diagnosis not present
